# Patient Record
Sex: Female | Born: 1980 | Race: White | Hispanic: No | Marital: Married
Health system: Southern US, Community
[De-identification: ages and names within clinical notes are randomized; demographics above are authoritative.]

---

## 2004-04-28 ENCOUNTER — Emergency Department: Payer: Self-pay | Admitting: Emergency Medicine

## 2004-07-27 ENCOUNTER — Ambulatory Visit: Payer: Self-pay | Admitting: Unknown Physician Specialty

## 2004-10-03 ENCOUNTER — Inpatient Hospital Stay: Payer: Self-pay | Admitting: Obstetrics & Gynecology

## 2005-02-03 ENCOUNTER — Emergency Department: Payer: Self-pay | Admitting: Emergency Medicine

## 2005-02-05 ENCOUNTER — Emergency Department: Payer: Self-pay | Admitting: Unknown Physician Specialty

## 2005-05-02 ENCOUNTER — Emergency Department: Payer: Self-pay | Admitting: Emergency Medicine

## 2006-05-10 ENCOUNTER — Emergency Department: Payer: Self-pay | Admitting: Emergency Medicine

## 2007-09-13 ENCOUNTER — Emergency Department: Payer: Self-pay | Admitting: Emergency Medicine

## 2008-04-19 ENCOUNTER — Emergency Department: Payer: Self-pay | Admitting: Emergency Medicine

## 2009-07-11 ENCOUNTER — Emergency Department: Payer: Self-pay | Admitting: Emergency Medicine

## 2010-09-11 IMAGING — CT CT HEAD WITHOUT CONTRAST
2 series · 16 of 30 positions shown, 20 images · non-contrast
Comparison: none

REASON FOR EXAM: LEFT SIDE FACIAL NUMBNESS
COMMENTS:

[Series 2: without · axial · non-contrast · 0.43mm/px · z∈[+192,+312]mm · 13 of 30 slices shown, 17 images]
[im 3/30  brain]
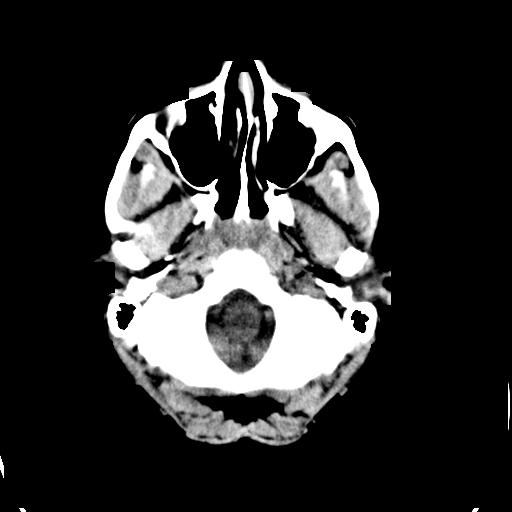
[im 3/30  bone]
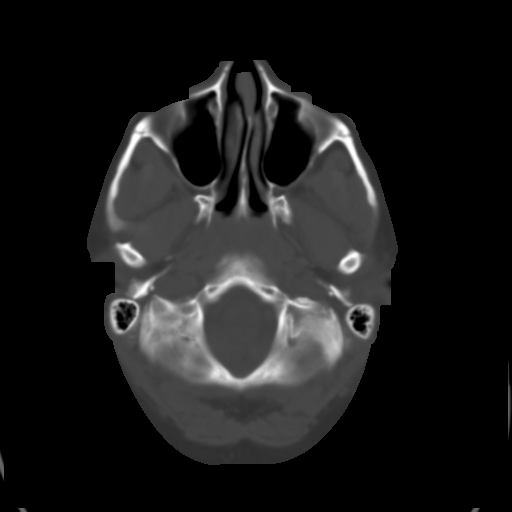
[im 5/30  brain]
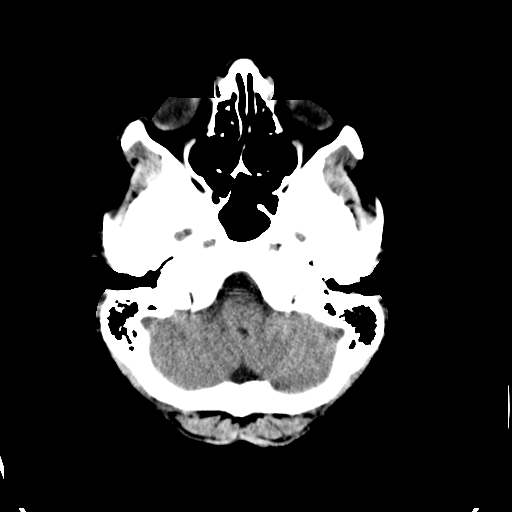
[im 7/30  brain]
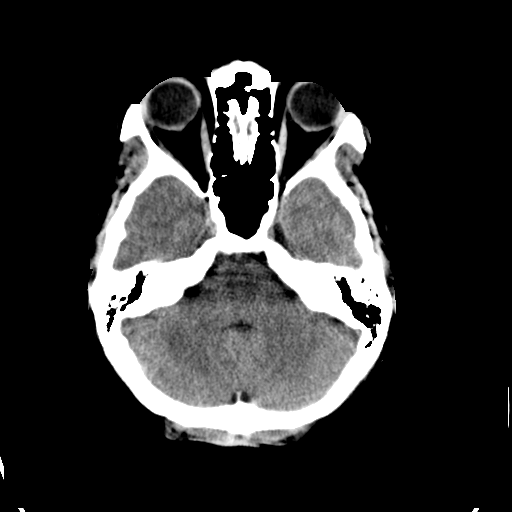
[im 9/30  brain]
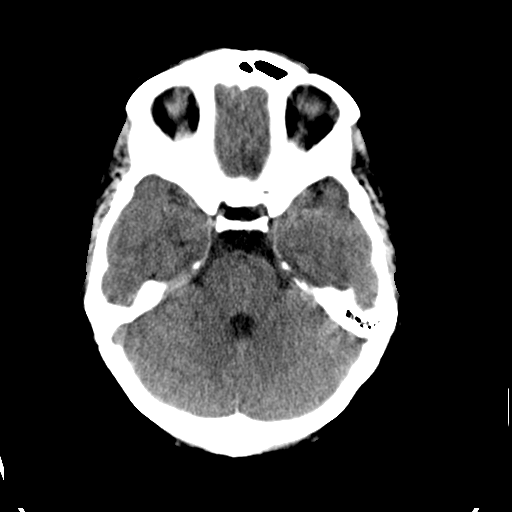
[im 11/30  brain]
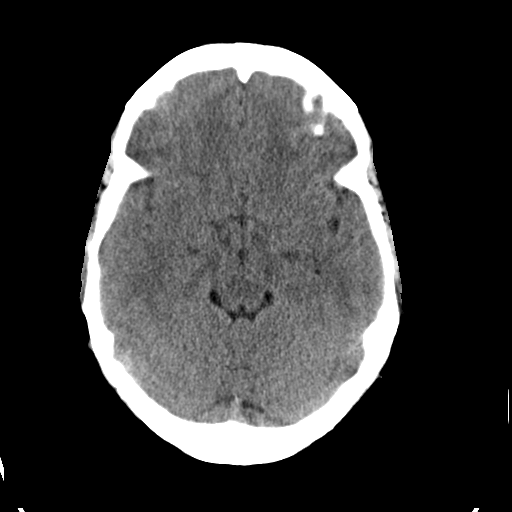
[im 11/30  bone]
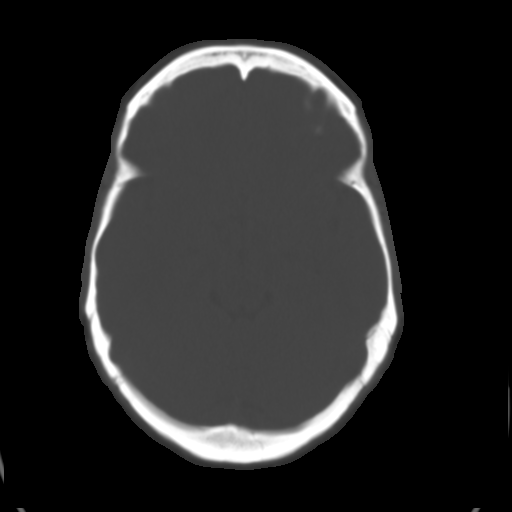
[im 13/30  brain]
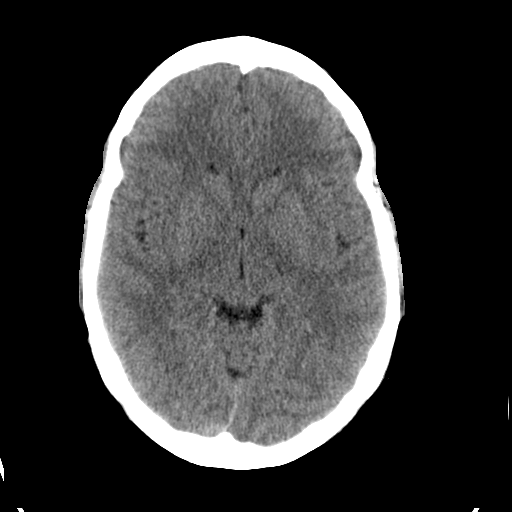
[im 15/30  brain]
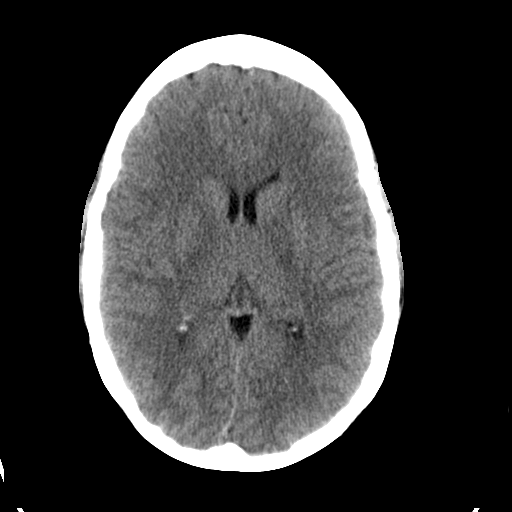
[im 17/30  brain]
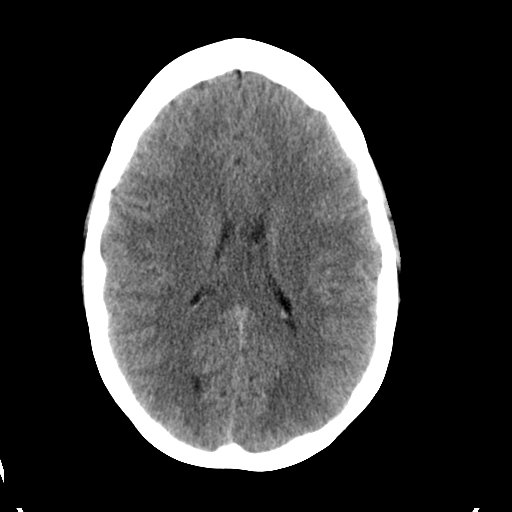
[im 19/30  brain]
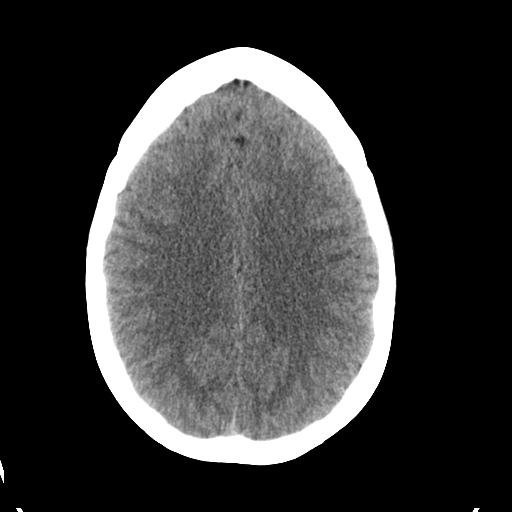
[im 19/30  bone]
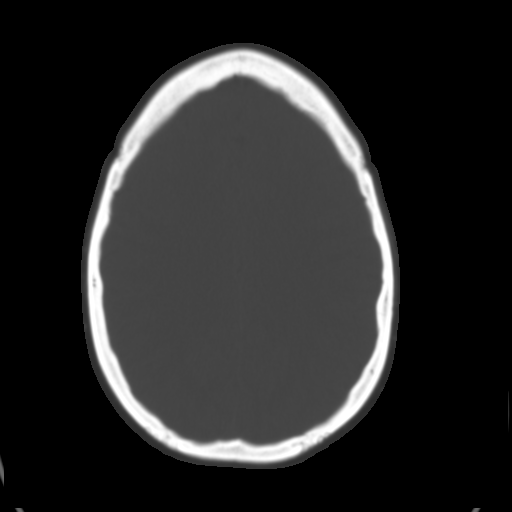
[im 21/30  brain]
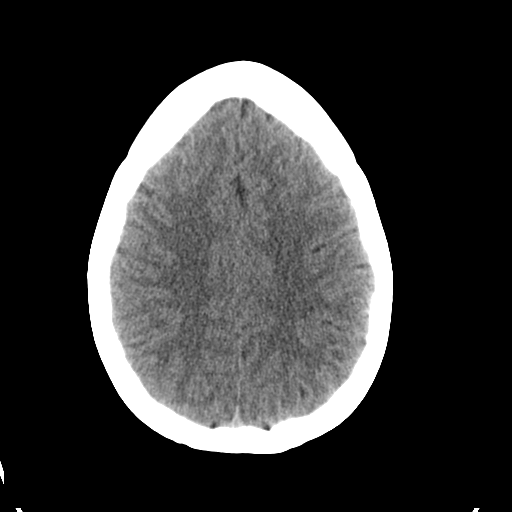
[im 23/30  brain]
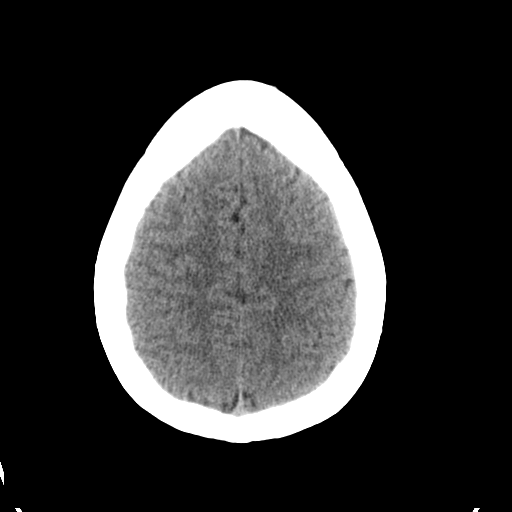
[im 25/30  brain]
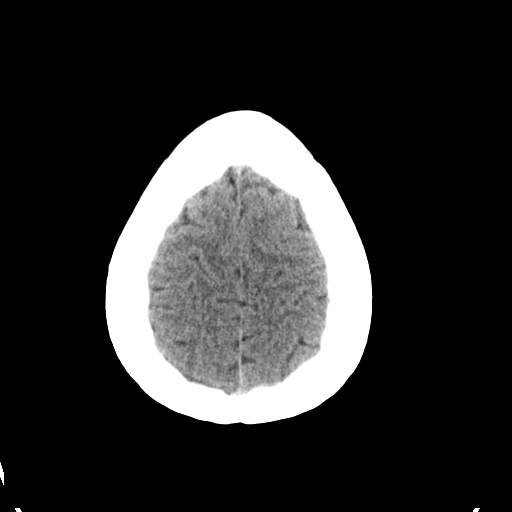
[im 27/30  brain]
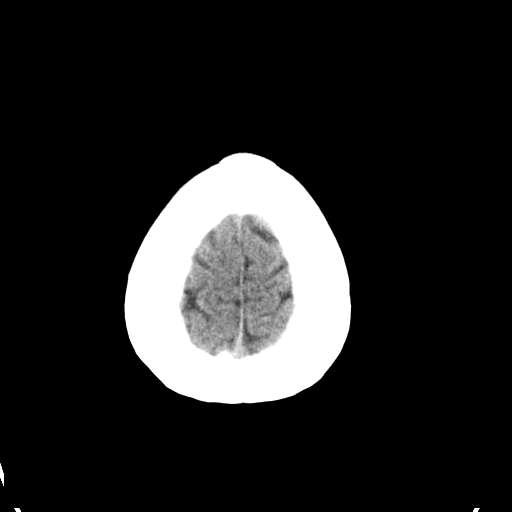
[im 27/30  bone]
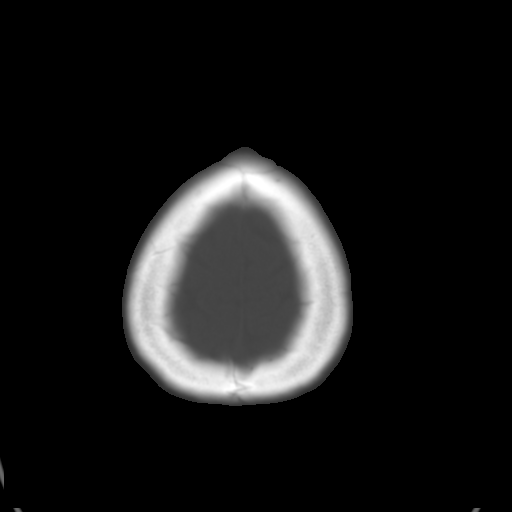

[Series 3: bone · axial · 0.43mm/px · z∈[+192,+232]mm · 3 of 30 slices shown]
[im 3/30  bone]
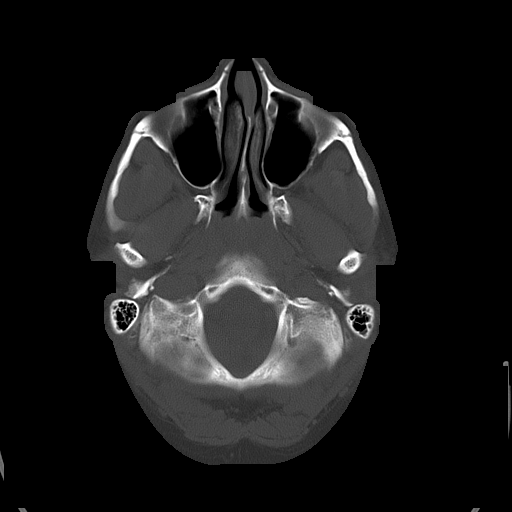
[im 7/30  bone]
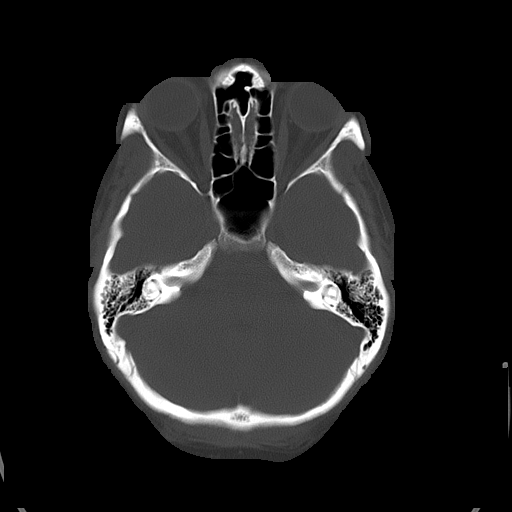
[im 11/30  bone]
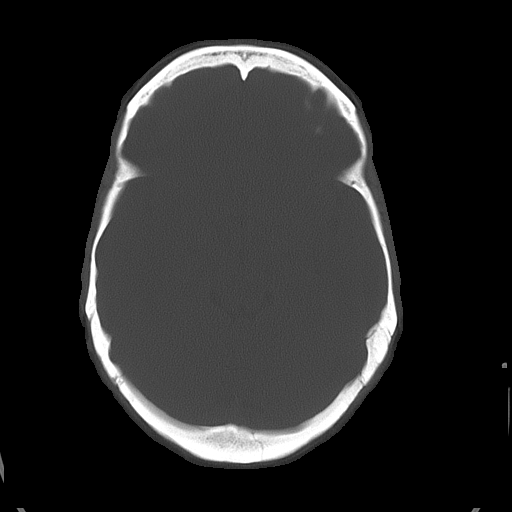

[16 of 30 positions shown; findings below may reference images not displayed]

PROCEDURE:     CT  - CT HEAD WITHOUT CONTRAST  - July 12, 2009  [DATE]

RESULT:     Axial noncontrast CT scanning was performed through the brain at
5 mm intervals and slice thicknesses.

The ventricles are normal in size and position. There is no intracranial
hemorrhage nor intracranial mass effect. There is no evidence of an evolving
ischemic infarction. At bone window settings the observed portions of the
paranasal sinuses exhibit no air-fluid levels. Minimal mucoperiosteal
thickening in the right maxillary sinus is present. There is no evidence of
an acute skull fracture.
IMPRESSION: I see no acute intracranial abnormality.

A preliminary report was sent to the [HOSPITAL] the conclusion
of the study.

## 2021-04-23 ENCOUNTER — Emergency Department
Admission: EM | Admit: 2021-04-23 | Discharge: 2021-04-23 | Disposition: A | Discharge status: Home / Self Care | Payer: BC Managed Care – PPO | Attending: Emergency Medicine | Admitting: Emergency Medicine

## 2021-04-23 ENCOUNTER — Emergency Department: Payer: BC Managed Care – PPO

## 2021-04-23 ENCOUNTER — Other Ambulatory Visit: Payer: Self-pay

## 2021-04-23 DIAGNOSIS — X509XXA Other and unspecified overexertion or strenuous movements or postures, initial encounter: Secondary | ICD-10-CM | POA: Insufficient documentation

## 2021-04-23 DIAGNOSIS — S3992XA Unspecified injury of lower back, initial encounter: Secondary | ICD-10-CM | POA: Diagnosis present

## 2021-04-23 DIAGNOSIS — S39012A Strain of muscle, fascia and tendon of lower back, initial encounter: Secondary | ICD-10-CM | POA: Insufficient documentation

## 2021-04-23 MED ORDER — NAPROXEN 375 MG PO TABS: 375.0000 mg | ORAL_TABLET | Freq: Two times a day (BID) | ORAL | 0 refills | Status: AC

## 2021-04-23 MED ORDER — HYDROMORPHONE HCL 1 MG/ML IJ SOLN
1.0000 mg | Freq: Once | INTRAMUSCULAR | Status: AC
  Administered 2021-04-23: 1 mg via INTRAMUSCULAR
  Filled 2021-04-23: qty 1

## 2021-04-23 MED ORDER — OXYCODONE-ACETAMINOPHEN 7.5-325 MG PO TABS: 1.0000 | ORAL_TABLET | Freq: Four times a day (QID) | ORAL | 0 refills | Status: AC | PRN

## 2021-04-23 MED ORDER — ORPHENADRINE CITRATE 30 MG/ML IJ SOLN
60.0000 mg | Freq: Two times a day (BID) | INTRAMUSCULAR | Status: DC
  Administered 2021-04-23: 60 mg via INTRAMUSCULAR
  Filled 2021-04-23: qty 2

## 2021-04-23 MED ORDER — ORPHENADRINE CITRATE ER 100 MG PO TB12: 100.0000 mg | ORAL_TABLET | Freq: Two times a day (BID) | ORAL | 0 refills | Status: AC

## 2021-04-23 NOTE — Discharge Instructions (Signed)
Read and follow discharge care instruction.  Take medication as directed. 

## 2021-04-23 NOTE — ED Triage Notes (Signed)
Pt states that she has had left lower back pain for the past couple days without injury, states that the pain radiates down her left buttock into her left leg

## 2021-04-23 NOTE — ED Notes (Signed)
This RN asked pt for urine sample for POC preg test. Pt states she just got back from the bathroom. She also states she has had her tubes tied.

## 2021-04-23 NOTE — ED Provider Notes (Signed)
[facility] Emergency Department Provider Note   ____________________________________________   Event Date/Time   First MD Initiated Contact with Patient 04/23/21 1007     (approximate)  I have reviewed the triage vital signs and the nursing notes.   HISTORY  Chief Complaint Back Pain    HPI Beth Ho is a 40 y.o. female patient complaining of acute low back pain for few days secondary to a flex and accident respectively pick up her cat.  Patient states she felt a "catch" in her back and now has a radicular component to the left lower leg.  Patient denies bladder or bowel dysfunction.  Patient denies previous history of back pain.  Rates pain as a 10/10.  Described pain as "sharp".  Patient states mild relief with anti-inflammatory medication and staying in a flexed position.  Patient unable to sleep secondary to pain.         No past medical history on file.  There are no problems to display for this patient.     Prior to Admission medications   Medication Sig Start Date End Date Taking? Authorizing Provider  naproxen (NAPROSYN) 375 MG tablet Take 1 tablet (375 mg total) by mouth 2 (two) times daily with a meal. 04/23/21  Yes Joni Reining, PA-C  orphenadrine (NORFLEX) 100 MG tablet Take 1 tablet (100 mg total) by mouth 2 (two) times daily. 04/23/21  Yes Joni Reining, PA-C  oxyCODONE-acetaminophen (PERCOCET) 7.5-325 MG tablet Take 1 tablet by mouth every 6 (six) hours as needed for severe pain. 04/23/21  Yes Joni Reining, PA-C    Allergies Patient has no known allergies.  No family history on file.  Social History    Review of Systems  Constitutional: No fever/chills Eyes: No visual changes. ENT: No sore throat. Cardiovascular: Denies chest pain. Respiratory: Denies shortness of breath. Gastrointestinal: No abdominal pain.  No nausea, no vomiting.  No diarrhea.  No constipation. Genitourinary: Negative for  dysuria. Musculoskeletal: Acute low back pain.   Skin: Negative for rash. Neurological: Negative for headaches, focal weakness or numbness.  ____________________________________________   PHYSICAL EXAM:  VITAL SIGNS: ED Triage Vitals  Enc Vitals Group     BP 04/23/21 0921 (!) 148/98     Pulse Rate 04/23/21 0921 86     Resp 04/23/21 0921 18     Temp 04/23/21 0921 97.8 F (36.6 C)     Temp Source 04/23/21 0921 Oral     SpO2 04/23/21 0921 100 %     Weight 04/23/21 0936 220 lb (99.8 kg)     Height 04/23/21 0936 5' (1.524 m)     Head Circumference --      Peak Flow --      Pain Score 04/23/21 0936 10     Pain Loc --      Pain Edu? --      Excl. in [place]? --    Constitutional: Alert and oriented.  Moderate distress. Eyes: Conjunctivae are normal. PERRL. EOMI. Head: Atraumatic. Nose: No congestion/rhinnorhea. Mouth/Throat: Mucous membranes are moist.  Oropharynx non-erythematous. Neck: No stridor.  No cervical spine tenderness to palpation. Hematological/Lymphatic/Immunilogical: No cervical lymphadenopathy. Cardiovascular: Normal rate, regular rhythm. Grossly normal heart sounds.  Good peripheral circulation. Respiratory: Normal respiratory effort.  No retractions. Lungs CTAB. Gastrointestinal: Soft and nontender. No distention. No abdominal bruits. No CVA tenderness. Genitourinary: Deferred Musculoskeletal: Patient remains in a flexed position throughout interview.  Patient has moderate guarding palpation L3-L5.  Remainder exam was  deferred secondary to patient's pain. Neurologic:  Normal speech and language. No gross focal neurologic deficits are appreciated. No gait instability. Skin:  Skin is warm, dry and intact. No rash noted. Psychiatric: Mood and affect are normal. Speech and behavior are normal.  ____________________________________________   LABS (all labs ordered are listed, but only abnormal results are displayed)  Labs Reviewed  POC URINE PREG, ED    ____________________________________________  EKG   ____________________________________________  RADIOLOGY I, Joni Reining, personally viewed and evaluated these images (plain radiographs) as part of my medical decision making, as well as reviewing the written report by the radiologist.  ED MD interpretation: No acute findings x-ray of the lumbar spine.  Official radiology report(s): DG Lumbar Spine 2-3 Views  Result Date: 04/23/2021 CLINICAL DATA:  Acute low back pain for 2 days, radiating into the left low back, left buttock and left lower extremity, no reported injury EXAM: LUMBAR SPINE - 2-3 VIEW COMPARISON:  None. FINDINGS: This report assumes 5 non rib-bearing lumbar vertebrae. Lumbar vertebral body heights are preserved, with no fracture. Lumbar disc heights are preserved. No spondylosis. No spondylolisthesis. No appreciable facet arthropathy. No aggressive appearing focal osseous lesions. IMPRESSION: No lumbar spine fracture or spondylolisthesis. No significant degenerative changes. Electronically Signed   By: Beth Phenix M.D.   On: 04/23/2021 12:24    ____________________________________________   PROCEDURES  Procedure(s) performed (including Critical Care):  Procedures   ____________________________________________   INITIAL IMPRESSION / ASSESSMENT AND PLAN / ED COURSE  As part of my medical decision making, I reviewed the following data within the electronic MEDICAL RECORD NUMBER         Patient presents with 2 days of low back pain secondary to Flexeril incident.  There is radicular component to the buttocks.  Discussed no acute findings on x-ray of the lumbar spine.  Patient complaint and physical exam consistent with lumbar strain for radicular component.  Patient given discharge care instruction advised take medication as directed.  Patient advised [facility]     ____________________________________________   FINAL CLINICAL  IMPRESSION(S) / ED DIAGNOSES  Final diagnoses:  Strain of lumbar region, initial encounter     ED Discharge Orders          Ordered    orphenadrine (NORFLEX) 100 MG tablet  2 times daily        04/23/21 1238    naproxen (NAPROSYN) 375 MG tablet  2 times daily with meals        04/23/21 1238    oxyCODONE-acetaminophen (PERCOCET) 7.5-325 MG tablet  Every 6 hours PRN        04/23/21 1239             Note:  This document was prepared using Dragon voice recognition software and may include unintentional dictation errors.    Joni Reining, PA-C 04/23/21 1242    Delton [place], MD 04/23/21 1415

## 2021-04-23 NOTE — ED Notes (Signed)
Dc ppw provided. Pt verbalized consent for dc. No questions at this time. Pt assisted off unit on foot. Rx information provided and followup provided

## 2021-07-11 ENCOUNTER — Other Ambulatory Visit: Payer: Self-pay

## 2021-07-11 ENCOUNTER — Emergency Department
Admission: EM | Admit: 2021-07-11 | Discharge: 2021-07-11 | Disposition: A | Discharge status: Home / Self Care | Payer: BC Managed Care – PPO | Attending: Emergency Medicine | Admitting: Emergency Medicine

## 2021-07-11 ENCOUNTER — Encounter: Payer: Self-pay | Admitting: Emergency Medicine

## 2021-07-11 DIAGNOSIS — N750 Cyst of Bartholin's gland: Secondary | ICD-10-CM | POA: Insufficient documentation

## 2021-07-11 DIAGNOSIS — R102 Pelvic and perineal pain: Secondary | ICD-10-CM | POA: Diagnosis present

## 2021-07-11 MED ORDER — SULFAMETHOXAZOLE-TRIMETHOPRIM 800-160 MG PO TABS
1.0000 | ORAL_TABLET | Freq: Two times a day (BID) | ORAL | 0 refills | Status: AC
Start: 2021-07-11 — End: 2021-07-18

## 2021-07-11 MED ORDER — LIDOCAINE HCL 1 % IJ SOLN
5.0000 mL | Freq: Once | INTRAMUSCULAR | Status: AC
  Administered 2021-07-11: 5 mL
  Filled 2021-07-11: qty 10

## 2021-07-11 MED ORDER — FENTANYL CITRATE PF 50 MCG/ML IJ SOSY
50.0000 ug | PREFILLED_SYRINGE | Freq: Once | INTRAMUSCULAR | Status: AC
  Administered 2021-07-11: 50 ug via INTRAMUSCULAR
  Filled 2021-07-11: qty 1

## 2021-07-11 MED ORDER — ONDANSETRON 4 MG PO TBDP
4.0000 mg | ORAL_TABLET | Freq: Once | ORAL | Status: AC
  Administered 2021-07-11: 4 mg via ORAL
  Filled 2021-07-11: qty 1

## 2021-07-11 NOTE — Discharge Instructions (Signed)
Take Bactrim twice daily for the next 7 days. 

## 2021-07-11 NOTE — ED Triage Notes (Addendum)
Pt to ED via POV with c/o a cyst on her groin. She has had it for 4 days there is no drainage. Pt is positive for COVID

## 2021-07-11 NOTE — ED Provider Notes (Signed)
[facility] Provider Note  Patient Contact: 11:16 PM (approximate)   History   Cyst   HPI  Beth Ho is a 41 y.o. female presents to the emergency department with 9 out of 10 vaginal pain.  Patient has a 3 cm x 3 cm Bartholin cyst on the left.  Patient denies fever and chills.  She states that she has been trying hot baths for the past 4 days with little relief.  No spontaneous drainage at home.      Physical Exam   Triage Vital Signs: ED Triage Vitals  Enc Vitals Group     BP 07/11/21 2125 (!) 150/105     Pulse Rate 07/11/21 2125 97     Resp 07/11/21 2125 20     Temp 07/11/21 2125 98.4 F (36.9 C)     Temp Source 07/11/21 2125 Oral     SpO2 07/11/21 2125 97 %     Weight 07/11/21 2052 220 lb 0.3 oz (99.8 kg)     Height 07/11/21 2052 5' (1.524 m)     Head Circumference --      Peak Flow --      Pain Score 07/11/21 2051 10     Pain Loc --      Pain Edu? --      Excl. in [place]? --     Most recent vital signs: Vitals:   07/11/21 2125  BP: (!) 150/105  Pulse: 97  Resp: 20  Temp: 98.4 F (36.9 C)  SpO2: 97%     General: Alert and in no acute distress. Cardiovascular:  Good peripheral perfusion Respiratory: Normal respiratory effort without tachypnea or retractions. Lungs CTAB. Good air entry to the bases with no decreased or absent breath sounds. Gastrointestinal: Bowel sounds 4 quadrants. Soft and nontender to palpation. No guarding or rigidity. No palpable masses. No distention. No CVA tenderness. Genitourinary: Patient has a 3 cm x 3 cm left-sided Bartholin cyst. Musculoskeletal: Full range of motion to all extremities.  Neurologic:  No gross focal neurologic deficits are appreciated.  Skin:   No rash noted Other:   ED Results / Procedures / Treatments   Labs (all labs ordered are listed, but only abnormal results are displayed) Labs Reviewed - No data to display         PROCEDURES:  Critical Care performed:  No  ..Incision and Drainage  Date/Time: 07/11/2021 11:18 PM Performed by: Lannie Fields, PA-C Authorized by: Lannie Fields, PA-C   Consent:    Consent obtained:  Verbal   Risks discussed:  Bleeding Universal protocol:    Procedure explained and questions answered to patient or proxy's satisfaction: yes     Patient identity confirmed:  Verbally with patient Location:    Type:  Abscess Sedation:    Sedation type:  None Anesthesia:    Anesthesia method:  Local infiltration   Local anesthetic:  Lidocaine 1% w/o epi Procedure type:    Complexity:  Simple Procedure details:    Ultrasound guidance: no     Incision types:  Stab incision   Wound management:  Probed and deloculated   Drainage:  Bloody   Drainage amount:  Copious   Wound treatment:  Wound left open   Packing materials:  None Post-procedure details:    Procedure completion:  Tolerated well, no immediate complications   MEDICATIONS ORDERED IN ED: Medications  fentaNYL (SUBLIMAZE) injection 50 mcg (50 mcg Intramuscular Given 07/11/21 2242)  ondansetron (ZOFRAN-ODT) disintegrating tablet  4 mg (4 mg Oral Given 07/11/21 2242)  lidocaine (XYLOCAINE) 1 % (with pres) injection 5 mL (5 mLs Infiltration Given 07/11/21 2242)     IMPRESSION / MDM / ASSESSMENT AND PLAN / ED COURSE  I reviewed the triage vital signs and the nursing notes.                              Differential diagnosis includes, but is not limited to,   Assessment and plan Bartholin cyst:  41 year old female presents to the emergency department with a left-sided Bartholin cyst.  Patient underwent incision and drainage without complication and was started on Bactrim. Return precautions were given to return with new or worsening symptoms.      FINAL CLINICAL IMPRESSION(S) / ED DIAGNOSES   Final diagnoses:  Bartholin cyst     Rx / DC Orders   ED Discharge Orders     None        Note:  This document was prepared using Dragon voice  recognition software and may include unintentional dictation errors.   Vallarie Mare [place], PA-C 07/11/21 2320    Arta Silence, MD 07/11/21 [phone]

## 2021-07-11 NOTE — ED Notes (Signed)
Provider at bedside to drain absess

## 2022-06-23 IMAGING — CR DG LUMBAR SPINE 2-3V
1 series · 3 of 3 positions shown · non-contrast
Comparison: None.

CLINICAL DATA: Acute low back pain for 2 days, radiating into the
left low back, left buttock and left lower extremity, no reported
injury

EXAM:
LUMBAR SPINE - 2-3 VIEW

[Series 1: dg lumbar spine 2-3 views · 0.14mm/px · 3 of 3 slices shown]
[im 1/3]
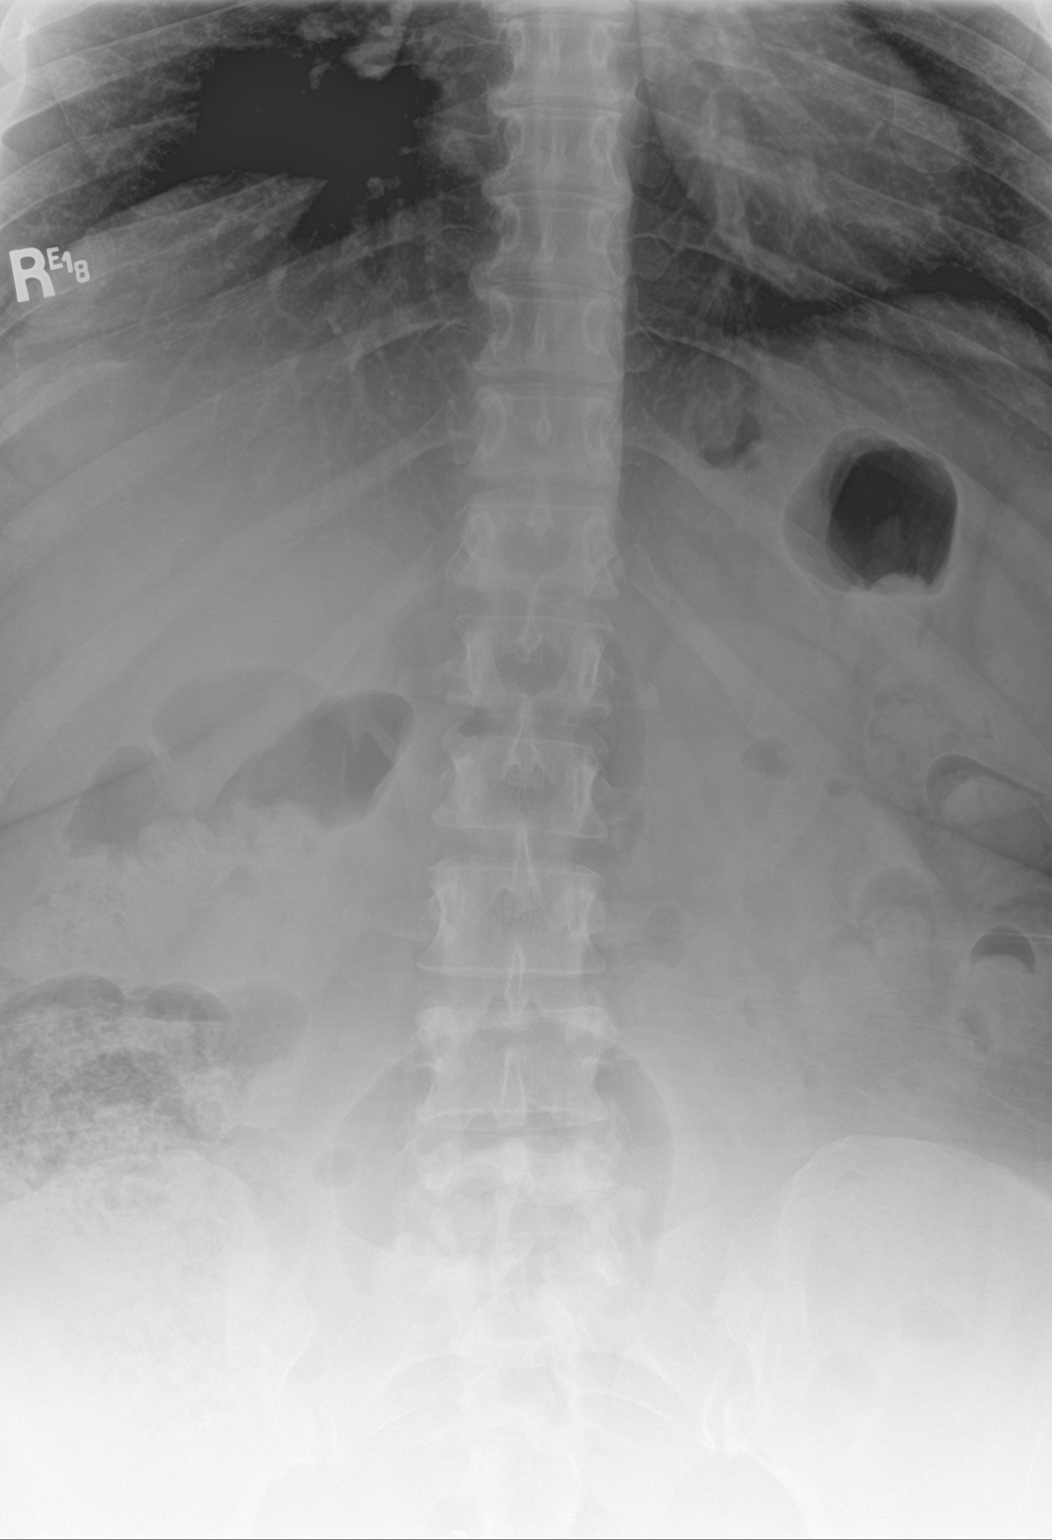
[im 2/3]
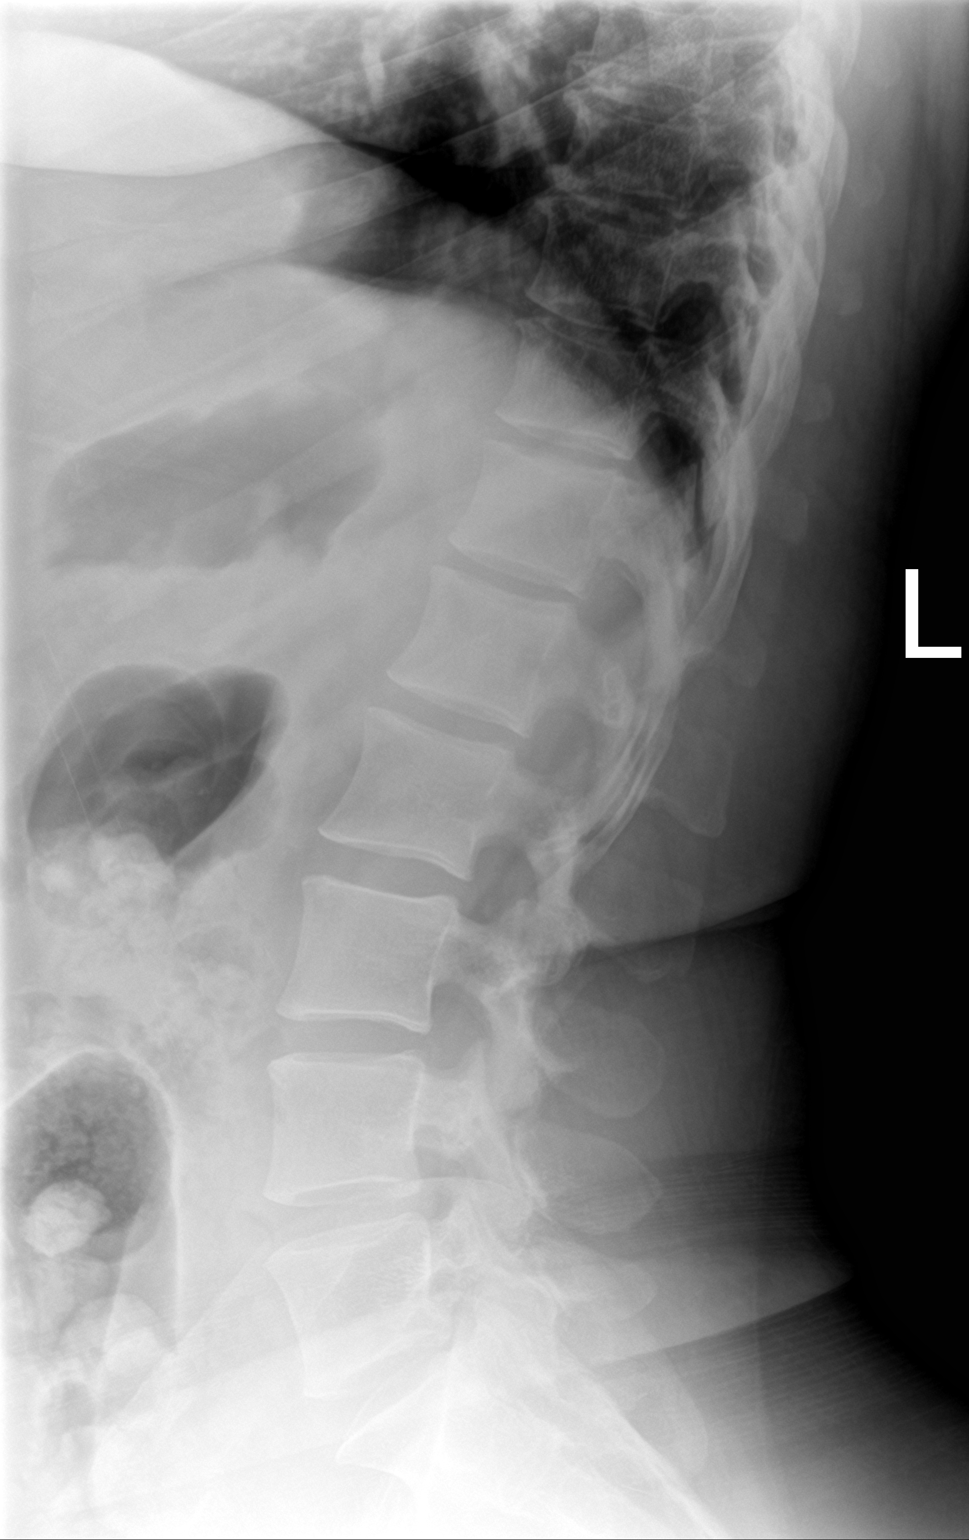
[im 3/3]
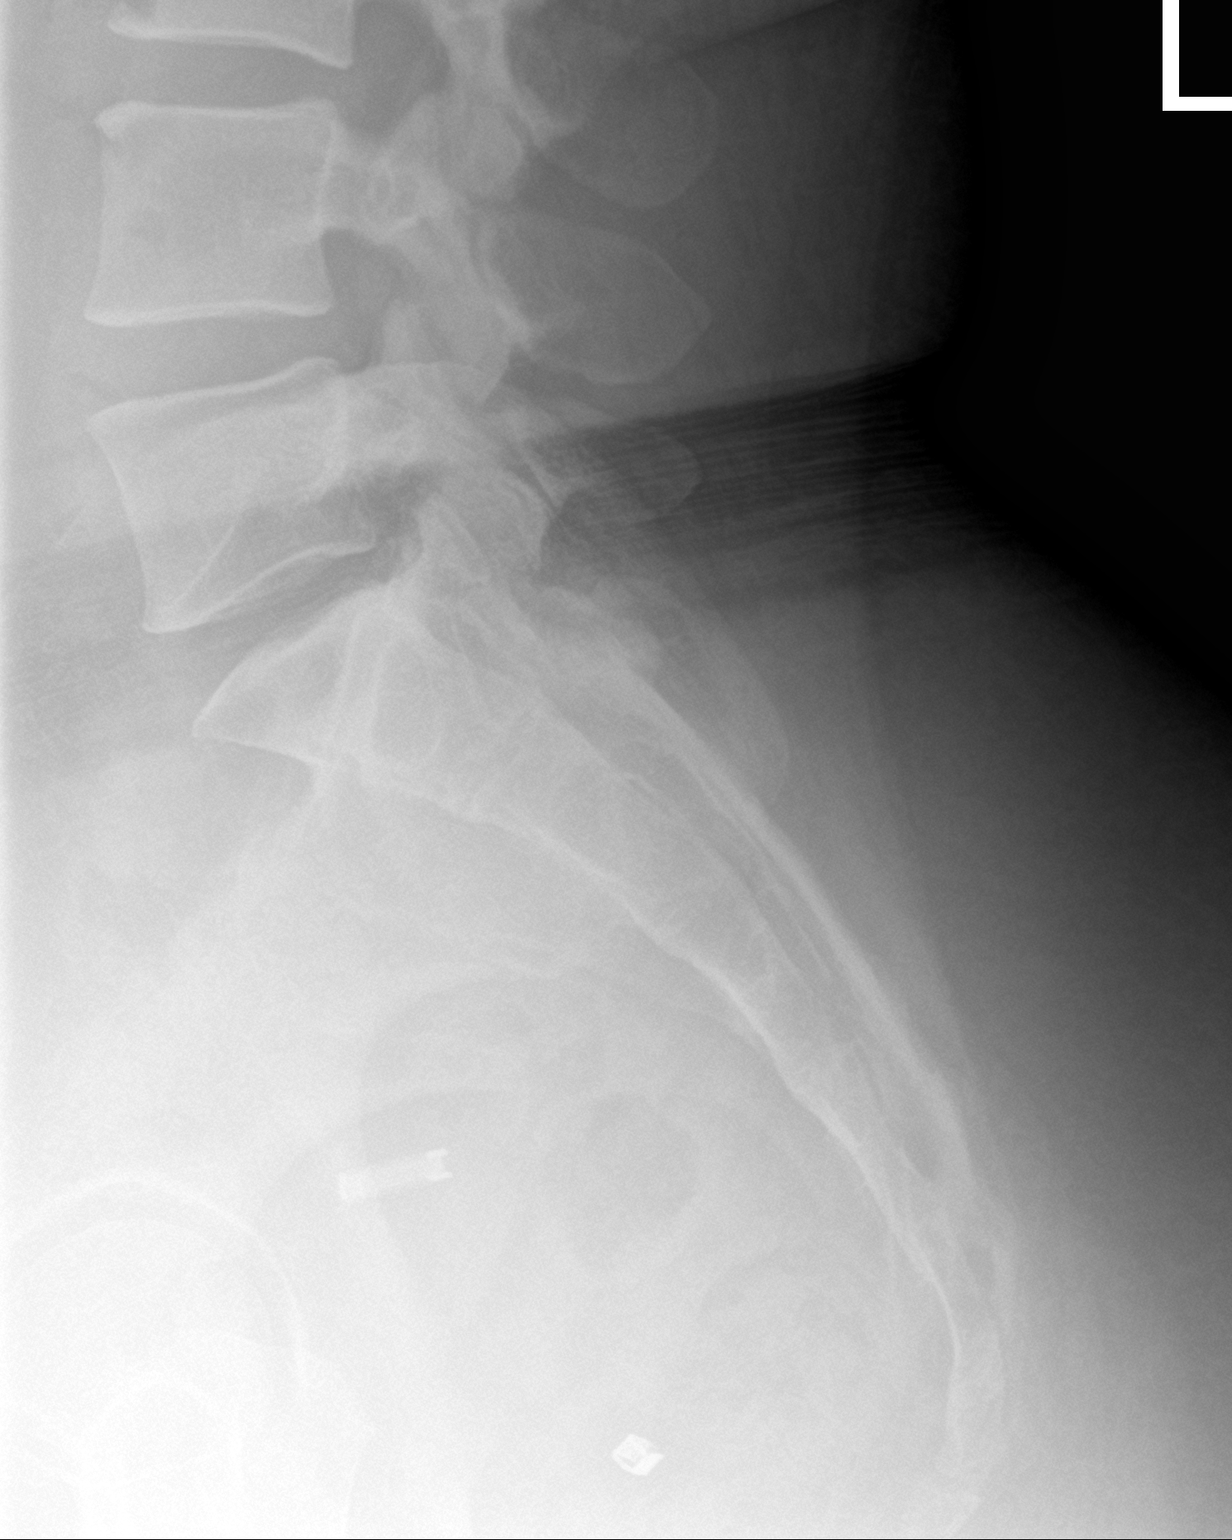

[3 of 3 positions shown; findings below may reference images not displayed]

FINDINGS: This report assumes 5 non rib-bearing lumbar vertebrae.

Lumbar vertebral body heights are preserved, with no fracture.

Lumbar disc heights are preserved. No spondylosis. No
spondylolisthesis. No appreciable facet arthropathy. No aggressive
appearing focal osseous lesions.
IMPRESSION: No lumbar spine fracture or spondylolisthesis. No significant
degenerative changes.
